# Patient Record
Sex: Female | Born: 1995 | Race: Black or African American | Hispanic: No | Marital: Single | Smoking: Light tobacco smoker
Health system: Southern US, Community
[De-identification: ages and names within clinical notes are randomized; demographics above are authoritative.]

## PROBLEM LIST (undated history)

## (undated) DIAGNOSIS — D649 Anemia, unspecified: Secondary | ICD-10-CM

## (undated) DIAGNOSIS — T7840XA Allergy, unspecified, initial encounter: Secondary | ICD-10-CM

## (undated) HISTORY — DX: Anemia, unspecified: D64.9

## (undated) HISTORY — DX: Allergy, unspecified, initial encounter: T78.40XA

---
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
## 2015-08-03 ENCOUNTER — Encounter: Payer: Self-pay | Admitting: Podiatry

## 2015-08-03 ENCOUNTER — Ambulatory Visit (INDEPENDENT_AMBULATORY_CARE_PROVIDER_SITE_OTHER): Payer: BLUE CROSS/BLUE SHIELD | Admitting: Podiatry

## 2015-08-03 VITALS — BP 99/64 | HR 74 | Resp 16 | Ht 68.0 in | Wt 176.0 lb

## 2015-08-03 DIAGNOSIS — L600 Ingrowing nail: Secondary | ICD-10-CM

## 2015-08-03 NOTE — Patient Instructions (Signed)

## 2015-08-03 NOTE — Progress Notes (Signed)
   Subjective:    Patient ID: Ashley Wilcox, female    DOB: 03-18-1995, 20 y.o.   MRN: [id number]  HPI Chief Complaint  Patient presents with  . Nail Problem    Right foot; great toe-lateral side; x2 months      Review of Systems  All other systems reviewed and are negative.      Objective:   Physical Exam        Assessment & Plan:

## 2015-08-04 NOTE — Progress Notes (Signed)
Subjective:     Patient ID: DELILA DOSTER, female   DOB: 16-Nov-1995, 20 y.o.   MRN: [id number]  HPI patient presents with a painful ingrown toenail deformity right hallux lateral border that has made shoe gear difficult and she's tried to soak it trim it without relief and did have a history of loss of nail   Review of Systems  All other systems reviewed and are negative.      Objective:   Physical Exam  Constitutional: She is oriented to person, place, and time.  Cardiovascular: Intact distal pulses.   Musculoskeletal: Normal range of motion.  Neurological: She is oriented to person, place, and time.  Skin: Skin is warm.  Nursing note and vitals reviewed.  neurovascular status intact muscle strength adequate range of motion within normal limits with patient found to have incurvated right hallux lateral border that's very painful when pressed with distal redness but no active drainage noted. Patient's found to have good digital perfusion and is well oriented 3     Assessment:     Ingrown toenail deformity right hallux lateral border that's painful when palpated    Plan:     H&P condition reviewed and recommended correction of deformity to family. I explained risk of procedure and they want this done and today I infiltrated 60 mg Xylocaine Marcaine mixture remove the lateral border exposed matrix and applied phenol 3 applications 30 seconds followed by alcohol lavage and sterile dressing. Instructed on soaks and reappoint

## 2015-08-15 ENCOUNTER — Ambulatory Visit (INDEPENDENT_AMBULATORY_CARE_PROVIDER_SITE_OTHER): Payer: BLUE CROSS/BLUE SHIELD | Admitting: Physician Assistant

## 2015-08-15 VITALS — BP 110/70 | HR 79 | Temp 98.5°F | Resp 18 | Ht 68.0 in | Wt 181.0 lb

## 2015-08-15 DIAGNOSIS — Z1329 Encounter for screening for other suspected endocrine disorder: Secondary | ICD-10-CM

## 2015-08-15 DIAGNOSIS — Z130 Encounter for screening for diseases of the blood and blood-forming organs and certain disorders involving the immune mechanism: Secondary | ICD-10-CM

## 2015-08-15 DIAGNOSIS — Z0000 Encounter for general adult medical examination without abnormal findings: Secondary | ICD-10-CM

## 2015-08-15 DIAGNOSIS — Z13228 Encounter for screening for other metabolic disorders: Secondary | ICD-10-CM | POA: Diagnosis not present

## 2015-08-15 LAB — COMPLETE METABOLIC PANEL WITH GFR
ALBUMIN: 4.2 g/dL (ref 3.6–5.1)
ALK PHOS: 83 U/L (ref 33–115)
ALT: 14 U/L (ref 6–29)
AST: 19 U/L (ref 10–30)
BILIRUBIN TOTAL: 0.4 mg/dL (ref 0.2–1.2)
BUN: 8 mg/dL (ref 7–25)
CALCIUM: 9.6 mg/dL (ref 8.6–10.2)
CO2: 27 mmol/L (ref 20–31)
CREATININE: 0.75 mg/dL (ref 0.50–1.10)
Chloride: 105 mmol/L (ref 98–110)
GFR, Est African American: >89 mL/min (ref >=60)
GFR, Est Non African American: >89 mL/min (ref >=60)
GLUCOSE: 90 mg/dL (ref 65–99)
POTASSIUM: 4.2 mmol/L (ref 3.5–5.3)
SODIUM: 137 mmol/L (ref 135–146)
TOTAL PROTEIN: 7 g/dL (ref 6.1–8.1)

## 2015-08-15 LAB — CBC
HEMATOCRIT: 40.8 % (ref 35.0–45.0)
HEMOGLOBIN: 13.5 g/dL (ref 11.7–15.5)
MCH: 28.8 pg (ref 27.0–33.0)
MCHC: 33.1 g/dL (ref 32.0–36.0)
MCV: 87.2 fL (ref 80.0–100.0)
MPV: 8.7 fL (ref 7.5–12.5)
Platelets: 326 10*3/uL (ref 140–400)
RBC: 4.68 MIL/uL (ref 3.80–5.10)
RDW: 14.1 % (ref 11.0–15.0)
WBC: 10.1 10*3/uL (ref 3.8–10.8)

## 2015-08-15 LAB — TSH: TSH: 1.81 mIU/L

## 2015-08-15 NOTE — Patient Instructions (Addendum)
Please make sure that you are hydrating well with 64 oz or more of water.  Please make sure that you get vegetables daily.  At least a 1/2 plate per day. I will have your lab results within the next 7-10 days.    Keeping You Healthy  Get These Tests 1. Blood Pressure- Have your blood pressure checked once a year by your health care provider.  Normal blood pressure is 120/80. 2. Weight- Have your body mass index (BMI) calculated to screen for obesity.  BMI is measure of body fat based on height and weight.  You can also calculate your own BMI at https://www.west-esparza.com/www.nhlbisupport.com/bmi/. 3. Cholesterol- Have your cholesterol checked every 5 years starting at age 20 then yearly starting at age 20. 4. Chlamydia, HIV, and other sexually transmitted diseases- Get screened every year until age 20, then within three months of each new sexual provider. 5. Pap Test - Every 1-5 years; discuss with your health care provider. 6. Mammogram- Every 1-2 years starting at age 20--50  Take these medicines  Calcium with Vitamin D-Your body needs 1200 mg of Calcium each day and [phone] IU of Vitamin D daily.  Your body can only absorb 500 mg of Calcium at a time so Calcium must be taken in 2 or 3 divided doses throughout the day.  Multivitamin with folic acid- Once daily if it is possible for you to become pregnant.  Get these Immunizations  Gardasil-Series of three doses; prevents HPV related illness such as genital warts and cervical cancer.  Menactra-Single dose; prevents meningitis.  Tetanus shot- Every 10 years.  Flu shot-Every year.  Take these steps 1. Do not smoke-Your healthcare provider can help you quit.  For tips on how to quit go to www.smokefree.gov or call 1-800 QUITNOW. 2. Be physically active- Exercise 5 days a week for at least 30 minutes.  If you are not already physically active, start slow and gradually work up to 30 minutes of moderate physical activity.  Examples of moderate activity include  walking briskly, dancing, swimming, bicycling, etc. 3. Breast Cancer- A self breast exam every month is important for early detection of breast cancer.  For more information and instruction on self breast exams, ask your healthcare provider or [place].eswww.womenshealth.gov/faq/breast-self-exam.cfm. 4. Eat a healthy diet- Eat a variety of healthy foods such as fruits, vegetables, whole grains, low fat milk, low fat cheeses, yogurt, lean meats, poultry and fish, beans, nuts, tofu, etc.  For more information go to www. Thenutritionsource.org 5. Drink alcohol in moderation- Limit alcohol intake to one drink or less per day. Never drink and drive. 6. Depression- Your emotional health is as important as your physical health.  If you're feeling down or losing interest in things you normally enjoy please talk to your healthcare provider about being screened for depression. 7. Dental visit- Brush and floss your teeth twice daily; visit your dentist twice a year. 8. Eye doctor- Get an eye exam at least every 2 years. 9. Helmet use- Always wear a helmet when riding a bicycle, motorcycle, rollerblading or skateboarding. 10. Safe sex- If you may be exposed to sexually transmitted infections, use a condom. 11. Seat belts- Seat belts can save your live; always wear one. 12. Smoke/Carbon Monoxide detectors- These detectors need to be installed on the appropriate level of your home. Replace batteries at least once a year. 13. Skin cancer- When out in the sun please cover up and use sunscreen 15 SPF or higher. 14. Violence- If anyone is threatening  or hurting you, please tell your healthcare provider.           IF you received an x-ray today, you will receive an invoice from [facility]. Please contact [facility] at [phone] with questions or concerns regarding your invoice.   IF you received labwork today, you will receive an invoice from [facility]. Please contact  [facility] at [phone] with questions or concerns regarding your invoice.   Our billing staff will not be able to assist you with questions regarding bills from these companies.  You will be contacted with the lab results as soon as they are available. The fastest way to get your results is to activate your My Chart account. Instructions are located on the last page of this paperwork. If you have not heard from us regarding the results in 2 weeks, please contact this office.

## 2015-08-15 NOTE — Progress Notes (Signed)
Patient ID: Ashley Wilcox, female   DOB: 04/05/1995, 20 y.o.   MRN: [id number] [facility] [address], [place] [address] [phone]  By signing my name below I, Shelah LewandowskyJoseph Thomas, attest that this documentation has been prepared under the direction and in the presence of Trena PlattStephanie English PA. Electonically Signed. Shelah LewandowskyJoseph Thomas, Scribe 08/16/2015 at 10:09 AM   Date:  08/15/2015   Name:  Ashley Wilcox   DOB:  04/05/1995   MRN:  [id number]  PCP:  No PCP Per Patient   Chief Complaint  Patient presents with   Annual Exam    with no pap per patient      History of Present Illness:  Ashley Wilcox is a 20 y.o. female patient who [facility] physical for volleyball for [employer].   Pt denies any dietary restrictions. Pt eats some veggies. Pt snacks on chips frequently. Pt drinks 2 bottles of water per day and one can of soda a day.  Pt reports 1-2 regular BMs daily. Pt denies diarrhea, constipation, blood in stool, or black tarry stool.  Pt denies any hematuria, dysuria, urinary frequency, or malodorous urine.  Pt reports that she sleeps through the night well and usually sleeps 6-8 hrs a night. Pt reports tension in her forehead" that bothers her and makes it difficult for her to fall asleep at night. Pt states her vision test was normal, but she thought she had blurry vision.   Pt states that she plays volleyball and hangs out with her friends for fun.  Pt states her menstrual cycle has been normal and regular. Pt states she is on hormonal birth control to control menstrual cramping.   Pt has never had a pelvic exam. Pt states she does not want a pelvic exam at this time as she has no vaginal discharge, vaginal pain, or any other symptoms.   Pt denies any throat pain, ear pain, cough, congestion, or fever. Pt states that she is healthy.  Pt is sexually active and has been having unprotected sex.   One occasion  of alcohol use. Pt uses tobacco and vapors once a month.   There are no active problems to display for this patient.   Past Medical History:  Diagnosis Date   Allergy    Anemia     History reviewed. No pertinent surgical history.  Social History  Substance Use Topics   Smoking status: Light Tobacco Smoker    Packs/day: 0.10    Types: Cigarettes, E-cigarettes   Smokeless tobacco: Current User     Comment: cigarettes and vaping socially may happen less than once per month   Alcohol use No    Family History  Problem Relation Age of Onset   Hypertension Mother    Cancer Maternal Grandfather    Bladder Cancer Paternal Grandmother    Hypertension Brother     No Known Allergies  Medication list has been reviewed and updated.  Current Outpatient Prescriptions on File Prior to Visit  Medication Sig Dispense Refill   MONONESSA 0.25-35 MG-MCG tablet TAKE 1 TABLET BY MOUTH EVERY DAY 28 tablet 7   No current facility-administered medications on file prior to visit.     ROS ROS unremarkable unless otherwise specified.  Physical Examination: BP 110/70 (BP Location: Right Arm, Patient Position: Sitting, Cuff Size: Small)    Pulse 79    Temp 98.5 F (36.9 C) (Oral)    Resp 18  Ht 5\' 8"  (1.727 m)    Wt 181 lb (82.1 kg)    LMP 07/15/2015    SpO2 99%    BMI 27.52 kg/m  Ideal Body Weight: @FLOWAMB ([id number])@[phone])@  Physical Exam  Constitutional: She is oriented to person, place, and time. She appears well-developed and well-nourished. No distress.  HENT:  Head: Normocephalic and atraumatic.  Right Ear: Tympanic membrane, external ear and ear canal normal.  Left Ear: Tympanic membrane, external ear and ear canal normal.  Nose: Right sinus exhibits no maxillary sinus tenderness and no frontal sinus tenderness. Left sinus exhibits no maxillary sinus tenderness and no frontal sinus tenderness.  Mouth/Throat: Oropharynx is clear and moist. No uvula swelling. No oropharyngeal  exudate, posterior oropharyngeal edema or posterior oropharyngeal erythema.  Eyes: Conjunctivae and EOM are normal. Pupils are equal, round, and reactive to light.  Neck: Normal range of motion. Neck supple. No thyromegaly present.  Cardiovascular: Normal rate, regular rhythm, normal heart sounds and intact distal pulses.  Exam reveals no gallop, no distant heart sounds and no friction rub.   No murmur heard. Pulmonary/Chest: Effort normal and breath sounds normal. No respiratory distress. She has no decreased breath sounds. She has no wheezes. She has no rhonchi.  Abdominal: Soft. Bowel sounds are normal. She exhibits no distension and no mass. There is no tenderness.  Musculoskeletal: Normal range of motion. She exhibits no edema or tenderness.  Lymphadenopathy:       Head (right side): No submandibular, no tonsillar, no preauricular and no posterior auricular adenopathy present.       Head (left side): No submandibular, no tonsillar, no preauricular and no posterior auricular adenopathy present.    She has no cervical adenopathy.  Neurological: She is alert and oriented to person, place, and time. No cranial nerve deficit. She exhibits normal muscle tone. Coordination normal.  Skin: Skin is warm and dry. She is not diaphoretic.  Psychiatric: She has a normal mood and affect. Her behavior is normal.     Assessment and Plan: Ashley Wilcox is a 20 y.o. female who is here today for an annual physical exam. Patient request general labs and it is fine to do so at this time. Discussed better vegetable intake as well as avoidance of snack foods. Follow-up with lab results. Declines any immunizations today. Annual physical exam - Plan: CBC, TSH, COMPLETE METABOLIC PANEL WITH GFR  Screening for deficiency anemia - Plan: CBC  Screening for thyroid disorder - Plan: TSH  Screening for metabolic disorder - Plan: COMPLETE METABOLIC PANEL WITH GFR  Trena PlattStephanie English, PA-C [facility] 08/16/2015 8:08 AM  I personally performed the services described in this documentation, which was scribed in my presence. The recorded information has been reviewed and is accurate.

## 2015-08-16 ENCOUNTER — Telehealth: Payer: Self-pay | Admitting: *Deleted

## 2015-08-16 ENCOUNTER — Encounter: Payer: Self-pay | Admitting: Physician Assistant

## 2015-08-16 NOTE — Telephone Encounter (Signed)
Left message for patient at [phone] (Home #) to check to see how they were doing from their ingrown toenail procedure that was performed on Wednesday, August 03, 2015. Waiting for a response.

## 2015-11-07 ENCOUNTER — Encounter: Payer: Self-pay | Admitting: Physician Assistant

## 2015-12-27 ENCOUNTER — Ambulatory Visit (INDEPENDENT_AMBULATORY_CARE_PROVIDER_SITE_OTHER): Payer: BLUE CROSS/BLUE SHIELD | Admitting: Family Medicine

## 2015-12-27 VITALS — BP 138/70 | HR 84 | Resp 16 | Ht 68.0 in | Wt 179.0 lb

## 2015-12-27 DIAGNOSIS — R350 Frequency of micturition: Secondary | ICD-10-CM

## 2015-12-27 DIAGNOSIS — N898 Other specified noninflammatory disorders of vagina: Secondary | ICD-10-CM | POA: Diagnosis not present

## 2015-12-27 LAB — POCT URINALYSIS DIP (MANUAL ENTRY)
BILIRUBIN UA: NEGATIVE
GLUCOSE UA: NEGATIVE
NITRITE UA: NEGATIVE
Protein Ur, POC: 30 — AB
Spec Grav, UA: 1.02
Urobilinogen, UA: 0.2
pH, UA: 8.5

## 2015-12-27 LAB — POCT WET + KOH PREP: Trich by wet prep: ABSENT

## 2015-12-27 LAB — POC MICROSCOPIC URINALYSIS (UMFC): MUCUS RE: ABSENT

## 2015-12-27 LAB — POCT URINE PREGNANCY: PREG TEST UR: NEGATIVE

## 2015-12-27 MED ORDER — FLUCONAZOLE 150 MG PO TABS: 150.0000 mg | ORAL_TABLET | Freq: Once | ORAL | 0 refills | Status: AC

## 2015-12-27 NOTE — Progress Notes (Signed)
   Patient ID: Ashley Wilcox, female    DOB: 1995/09/04, 20 y.o.   MRN: [id number]  PCP: No PCP Per Patient  Chief Complaint  Patient presents with  . Dysuria    x1 week; denies having an odor    Subjective:   HPI 20 year old female presents for evaluation of urinary frequency and vaginal discharge x 1 week. Denies urine odor or changes in color. Denies burning with urination. Reports a sensation that she needs to urinate frequently and at times no urine production occurs. Denies chills or feeling feverish. Reports a vaginal discharge x 1 week. Reports vaginal itching. Discharge is thickened.  Sensation nausea and vomited x 1. LMPD about 2-3 weeks ago. Sexually active, unprotected with one partner.  Review of Systems See HPI There are no active problems to display for this patient.    Prior to Admission medications   Medication Sig Start Date End Date Taking? Authorizing Provider  MONONESSA 0.25-35 MG-MCG tablet TAKE 1 TABLET BY MOUTH EVERY DAY 12/11/14  Yes Ofilia NeasMichael L Clark, PA-C  No Known Allergies   Objective:  Physical Exam  Constitutional: She is oriented to person, place, and time. She appears well-developed and well-nourished.  Eyes: Pupils are equal, round, and reactive to light.  Cardiovascular: Normal rate, regular rhythm, normal heart sounds and intact distal pulses.   Pulmonary/Chest: Effort normal and breath sounds normal.  Abdominal: Soft. Bowel sounds are normal.  Genitourinary: Vaginal discharge found.  Genitourinary Comments: Cervix is erythematous.  No inguinal lymphadenopathy. Vaginal mucosa is erythematous with an abundance of thicken white discharge. No cervical motion tenderness. Wet Prep obtained.  Neurological: She is alert and oriented to person, place, and time.  Skin: Skin is warm and dry.  Psychiatric: She has a normal mood and affect. Her behavior is normal. Judgment and thought content normal.      Today's Vitals   12/27/15 1513  BP:  138/70  Pulse: 84  Resp: 16  SpO2: 99%  Weight: 179 lb (81.2 kg)  Height: 5\' 8"  (1.727 m)   Assessment & Plan:  1. Urinary frequency, all negative, likely related to vaginal irritation from yeast infection. - POCT urinalysis dipstick - POCT Microscopic Urinalysis ([facility]) - POCT urine pregnancy - Urine culture - GC/Chlamydia Probe Amp  2. Vaginal discharge - POCT Wet + KOH Prep-Confirmed Yeast present  - GC/Chlamydia Probe Amp   . fluconazole (DIFLUCAN) 150 MG tablet    Sig: Take 1 tablet (150 mg total) by mouth once. Repeat if needed    Dispense:  2 tablet    Godfrey PickKimberly S. Tiburcio PeaHarris, MSN, FNP-C [facility]

## 2015-12-27 NOTE — Patient Instructions (Addendum)
You have a yeast infection. I am prescribing you Fluconazole 150 mg once. If symptoms persists please follow-up!    IF you received an x-ray today, you will receive an invoice from [facility]. Please contact [facility] at [phone] with questions or concerns regarding your invoice.   IF you received labwork today, you will receive an invoice from [facility]. Please contact [facility] at [phone] with questions or concerns regarding your invoice.   Our billing staff will not be able to assist you with questions regarding bills from these companies.  You will be contacted with the lab results as soon as they are available. The fastest way to get your results is to activate your My Chart account. Instructions are located on the last page of this paperwork. If you have not heard from us regarding the results in 2 weeks, please contact this office.     Vaginal Yeast infection, Adult Vaginal yeast infection is a condition that causes soreness, swelling, and redness (inflammation) of the vagina. It also causes vaginal discharge. This is a common condition. Some women get this infection frequently. What are the causes? This condition is caused by a change in the normal balance of the yeast (candida) and bacteria that live in the vagina. This change causes an overgrowth of yeast, which causes the inflammation. What increases the risk? This condition is more likely to develop in:  Women who take antibiotic medicines.  Women who have diabetes.  Women who take birth control pills.  Women who are pregnant.  Women who douche often.  Women who have a weak defense (immune) system.  Women who have been taking steroid medicines for a long time.  Women who frequently wear tight clothing. What are the signs or symptoms? Symptoms of this condition include:  White, thick vaginal discharge.  Swelling, itching, redness, and irritation of the vagina. The lips of the vagina  (vulva) may be affected as well.  Pain or a burning feeling while urinating.  Pain during sex. How is this diagnosed? This condition is diagnosed with a medical history and physical exam. This will include a pelvic exam. Your health care provider will examine a sample of your vaginal discharge under a microscope. Your health care provider may send this sample for testing to confirm the diagnosis. How is this treated? This condition is treated with medicine. Medicines may be over-the-counter or prescription. You may be told to use one or more of the following:  Medicine that is taken orally.  Medicine that is applied as a cream.  Medicine that is inserted directly into the vagina (suppository). Follow these instructions at home:  Take or apply over-the-counter and prescription medicines only as told by your health care provider.  Do not have sex until your health care provider has approved. Tell your sex partner that you have a yeast infection. That person should go to his or her health care provider if he or she develops symptoms.  Do not wear tight clothes, such as pantyhose or tight pants.  Avoid using tampons until your health care provider approves.  Eat more yogurt. This may help to keep your yeast infection from returning.  Try taking a sitz bath to help with discomfort. This is a warm water bath that is taken while you are sitting down. The water should only come up to your hips and should cover your buttocks. Do this 3-4 times per day or as told by your health care provider.  Do not douche.  Wear breathable,  cotton underwear.  If you have diabetes, keep your blood sugar levels under control. Contact a health care provider if:  You have a fever.  Your symptoms go away and then return.  Your symptoms do not get better with treatment.  Your symptoms get worse.  You have new symptoms.  You develop blisters in or around your vagina.  You have blood coming from your  vagina and it is not your menstrual period.  You develop pain in your abdomen. This information is not intended to replace advice given to you by your health care provider. Make sure you discuss any questions you have with your health care provider. Document Released: 09/27/2004 Document Revised: 06/01/2015 Document Reviewed: 06/21/2014 Elsevier Interactive Patient Education  2017 [employer]

## 2015-12-29 ENCOUNTER — Telehealth: Payer: Self-pay

## 2015-12-29 LAB — GC/CHLAMYDIA PROBE AMP
CHLAMYDIA, DNA PROBE: NEGATIVE
NEISSERIA GONORRHOEAE BY PCR: NEGATIVE

## 2015-12-29 NOTE — Telephone Encounter (Signed)
PATIENT SAW Ashley Wilcox ON Tuesday FOR A POSSIBLE UTI AND YEAST INFECTION. SHE SAID HER URINE WAS CHECKED AND A PREGNANCY TEST WAS DONE. SHE WOULD LIKE TO GET THE RESULTS. BEST PHONE [phone] (CELL) PHARMACY CHOICE IS [facility] ON [address].  MBC

## 2015-12-30 LAB — URINE CULTURE

## 2016-01-03 NOTE — Telephone Encounter (Signed)
Left message to call back  

## 2016-01-04 NOTE — Telephone Encounter (Signed)
Notified pt of lab results and she stated that her symptoms have resolved.

## 2016-01-04 NOTE — Telephone Encounter (Signed)
She was informed of her results during the visit and treated for a positive yeast infection with Diflucan. If her discharge is still present, we can send another dose of diflucan. All other results were negative and she should have received a letter. Please call pt to advise.  Thanks  Godfrey PickKimberly S. Tiburcio PeaHarris, MSN, FNP-C [facility] [phone]

## 2016-01-04 NOTE — Telephone Encounter (Signed)
Ashley BradfordKimberly, I don't see notes yet on pt's labs. Please review and release to pt or comment and send to Lab for call to pt. Thanks!

## 2016-01-09 ENCOUNTER — Other Ambulatory Visit: Payer: Self-pay | Admitting: Physician Assistant

## 2016-09-29 ENCOUNTER — Other Ambulatory Visit: Payer: Self-pay | Admitting: Physician Assistant

## 2017-04-02 ENCOUNTER — Encounter: Payer: Self-pay | Admitting: Physician Assistant
